# Patient Record
Sex: Male | Born: 1950 | Race: Black or African American | Hispanic: No | Marital: Single | Smoking: Light tobacco smoker
Health system: Southern US, Community
[De-identification: ages and names within clinical notes are randomized; demographics above are authoritative.]

## PROBLEM LIST (undated history)

## (undated) DIAGNOSIS — H269 Unspecified cataract: Secondary | ICD-10-CM

## (undated) DIAGNOSIS — E785 Hyperlipidemia, unspecified: Secondary | ICD-10-CM

## (undated) DIAGNOSIS — I639 Cerebral infarction, unspecified: Secondary | ICD-10-CM

## (undated) DIAGNOSIS — H409 Unspecified glaucoma: Secondary | ICD-10-CM

## (undated) DIAGNOSIS — I10 Essential (primary) hypertension: Secondary | ICD-10-CM

## (undated) HISTORY — PX: OTHER SURGICAL HISTORY: SHX169

## (undated) HISTORY — DX: Unspecified cataract: H26.9

## (undated) HISTORY — DX: Essential (primary) hypertension: I10

## (undated) HISTORY — DX: Unspecified glaucoma: H40.9

## (undated) HISTORY — PX: FRACTURE SURGERY: SHX138

## (undated) HISTORY — DX: Cerebral infarction, unspecified: I63.9

## (undated) HISTORY — DX: Hyperlipidemia, unspecified: E78.5

---

## 2004-09-14 ENCOUNTER — Emergency Department (HOSPITAL_COMMUNITY): Admission: EM | Admit: 2004-09-14 | Discharge: 2004-09-14 | Payer: Self-pay | Admitting: Emergency Medicine

## 2005-06-14 ENCOUNTER — Inpatient Hospital Stay (HOSPITAL_COMMUNITY): Admission: AC | Admit: 2005-06-14 | Discharge: 2005-06-19 | Payer: Self-pay

## 2006-10-16 ENCOUNTER — Emergency Department (HOSPITAL_COMMUNITY): Admission: EM | Admit: 2006-10-16 | Discharge: 2006-10-16 | Payer: Self-pay | Admitting: Emergency Medicine

## 2007-01-01 ENCOUNTER — Emergency Department (HOSPITAL_COMMUNITY): Admission: EM | Admit: 2007-01-01 | Discharge: 2007-01-01 | Payer: Self-pay | Admitting: Emergency Medicine

## 2007-07-11 ENCOUNTER — Emergency Department (HOSPITAL_COMMUNITY): Admission: EM | Admit: 2007-07-11 | Discharge: 2007-07-11 | Payer: Self-pay | Admitting: Emergency Medicine

## 2007-08-10 ENCOUNTER — Ambulatory Visit: Payer: Self-pay | Admitting: Internal Medicine

## 2007-08-10 ENCOUNTER — Ambulatory Visit: Payer: Self-pay | Admitting: *Deleted

## 2007-08-25 ENCOUNTER — Ambulatory Visit: Payer: Self-pay | Admitting: Internal Medicine

## 2007-08-25 LAB — CONVERTED CEMR LAB
AST: 50 units/L — ABNORMAL HIGH (ref 0–37)
Albumin: 4.1 g/dL (ref 3.5–5.2)
Alkaline Phosphatase: 66 units/L (ref 39–117)
Basophils Absolute: 0 10*3/uL (ref 0.0–0.1)
Basophils Relative: 0 % (ref 0–1)
Benzodiazepines.: NEGATIVE
Creatinine,U: 213.5 mg/dL
LDL Cholesterol: 89 mg/dL (ref 0–99)
Lymphocytes Relative: 40 % (ref 12–46)
MCHC: 33 g/dL (ref 30.0–36.0)
Monocytes Relative: 11 % (ref 3–12)
Neutro Abs: 1.6 10*3/uL — ABNORMAL LOW (ref 1.7–7.7)
Neutrophils Relative %: 47 % (ref 43–77)
Opiate Screen, Urine: NEGATIVE
PSA: 3.64 ng/mL (ref 0.10–4.00)
Phencyclidine (PCP): NEGATIVE
Potassium: 4.4 meq/L (ref 3.5–5.3)
Propoxyphene: NEGATIVE
RBC: 4.26 M/uL (ref 4.22–5.81)
RDW: 13.1 % (ref 11.5–15.5)
Sodium: 143 meq/L (ref 135–145)
TSH: 1.162 microintl units/mL (ref 0.350–5.50)
Total Protein: 7.1 g/dL (ref 6.0–8.3)

## 2007-09-05 ENCOUNTER — Ambulatory Visit: Payer: Self-pay | Admitting: Internal Medicine

## 2007-09-05 LAB — CONVERTED CEMR LAB
Hep A Total Ab: POSITIVE — AB
Hep B Core Total Ab: NEGATIVE

## 2007-09-07 ENCOUNTER — Ambulatory Visit: Payer: Self-pay | Admitting: Internal Medicine

## 2007-09-22 ENCOUNTER — Emergency Department (HOSPITAL_COMMUNITY): Admission: EM | Admit: 2007-09-22 | Discharge: 2007-09-23 | Payer: Self-pay | Admitting: Emergency Medicine

## 2007-10-17 ENCOUNTER — Ambulatory Visit: Payer: Self-pay | Admitting: Internal Medicine

## 2007-10-17 ENCOUNTER — Encounter: Payer: Self-pay | Admitting: Family Medicine

## 2007-10-17 LAB — CONVERTED CEMR LAB
HCV Genotype: 1
aPTT: 69 s — ABNORMAL HIGH (ref 24–37)

## 2007-11-28 ENCOUNTER — Ambulatory Visit: Payer: Self-pay | Admitting: Internal Medicine

## 2007-12-06 ENCOUNTER — Ambulatory Visit: Payer: Self-pay | Admitting: Internal Medicine

## 2007-12-06 ENCOUNTER — Encounter: Payer: Self-pay | Admitting: Family Medicine

## 2007-12-06 LAB — CONVERTED CEMR LAB
BUN: 20 mg/dL (ref 6–23)
Benzodiazepines.: NEGATIVE
Creatinine,U: 151.2 mg/dL
Marijuana Metabolite: NEGATIVE
Phencyclidine (PCP): NEGATIVE
Potassium: 4.2 meq/L (ref 3.5–5.3)
Propoxyphene: NEGATIVE

## 2008-01-31 ENCOUNTER — Ambulatory Visit: Payer: Self-pay | Admitting: Family Medicine

## 2008-02-24 ENCOUNTER — Ambulatory Visit: Payer: Self-pay | Admitting: Internal Medicine

## 2008-03-14 ENCOUNTER — Ambulatory Visit: Payer: Self-pay | Admitting: Internal Medicine

## 2008-03-27 ENCOUNTER — Ambulatory Visit (HOSPITAL_COMMUNITY): Admission: RE | Admit: 2008-03-27 | Discharge: 2008-03-27 | Payer: Self-pay | Admitting: Interventional Cardiology

## 2008-07-20 ENCOUNTER — Ambulatory Visit: Payer: Self-pay | Admitting: Internal Medicine

## 2008-07-23 ENCOUNTER — Ambulatory Visit (HOSPITAL_COMMUNITY): Admission: RE | Admit: 2008-07-23 | Discharge: 2008-07-23 | Payer: Self-pay | Admitting: Family Medicine

## 2008-07-30 ENCOUNTER — Emergency Department (HOSPITAL_COMMUNITY): Admission: EM | Admit: 2008-07-30 | Discharge: 2008-07-30 | Payer: Self-pay | Admitting: Emergency Medicine

## 2008-08-24 ENCOUNTER — Ambulatory Visit: Payer: Self-pay | Admitting: Internal Medicine

## 2008-11-12 ENCOUNTER — Encounter: Payer: Self-pay | Admitting: Internal Medicine

## 2008-11-12 ENCOUNTER — Ambulatory Visit: Payer: Self-pay | Admitting: Internal Medicine

## 2008-11-12 DIAGNOSIS — R21 Rash and other nonspecific skin eruption: Secondary | ICD-10-CM

## 2008-11-21 ENCOUNTER — Ambulatory Visit: Payer: Self-pay | Admitting: Internal Medicine
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
## 2008-12-28 ENCOUNTER — Emergency Department (HOSPITAL_COMMUNITY): Admission: EM | Admit: 2008-12-28 | Discharge: 2008-12-28 | Payer: Self-pay | Admitting: Emergency Medicine

## 2009-02-20 ENCOUNTER — Ambulatory Visit: Payer: Self-pay | Admitting: Internal Medicine
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
## 2010-09-07 LAB — DIFFERENTIAL
Lymphocytes Relative: 24 % (ref 12–46)
Lymphs Abs: 1.7 10*3/uL (ref 0.7–4.0)
Monocytes Absolute: 0.6 10*3/uL (ref 0.1–1.0)
Monocytes Relative: 8 % (ref 3–12)
Neutro Abs: 5 10*3/uL (ref 1.7–7.7)

## 2010-09-07 LAB — CBC
Hemoglobin: 12.1 g/dL — ABNORMAL LOW (ref 13.0–17.0)
RBC: 3.6 MIL/uL — ABNORMAL LOW (ref 4.22–5.81)
WBC: 7.4 10*3/uL (ref 4.0–10.5)

## 2010-09-07 LAB — BASIC METABOLIC PANEL
CO2: 21 mEq/L (ref 19–32)
Calcium: 8.8 mg/dL (ref 8.4–10.5)
GFR calc Af Amer: 57 mL/min — ABNORMAL LOW (ref >60)
GFR calc non Af Amer: 47 mL/min — ABNORMAL LOW (ref >60)
Sodium: 143 mEq/L (ref 135–145)

## 2010-09-07 LAB — POCT CARDIAC MARKERS
CKMB, poc: 2.1 ng/mL (ref 1.0–8.0)
Troponin i, poc: <0.05 ng/mL (ref 0.00–0.09)

## 2010-09-09 ENCOUNTER — Emergency Department (HOSPITAL_COMMUNITY)
Admission: EM | Admit: 2010-09-09 | Discharge: 2010-09-09 | Discharge status: Against Medical Advice | Payer: Medicaid Other | Attending: Emergency Medicine | Admitting: Emergency Medicine

## 2010-09-09 DIAGNOSIS — Z0389 Encounter for observation for other suspected diseases and conditions ruled out: Secondary | ICD-10-CM | POA: Insufficient documentation

## 2010-09-11 LAB — CBC
HCT: 40.2 % (ref 39.0–52.0)
Hemoglobin: 14 g/dL (ref 13.0–17.0)
MCHC: 34.9 g/dL (ref 30.0–36.0)
RBC: 4.16 MIL/uL — ABNORMAL LOW (ref 4.22–5.81)
RDW: 12.7 % (ref 11.5–15.5)

## 2010-09-11 LAB — COMPREHENSIVE METABOLIC PANEL
Alkaline Phosphatase: 65 U/L (ref 39–117)
BUN: 19 mg/dL (ref 6–23)
CO2: 26 mEq/L (ref 19–32)
GFR calc non Af Amer: 41 mL/min — ABNORMAL LOW (ref >60)
Glucose, Bld: 122 mg/dL — ABNORMAL HIGH (ref 70–99)
Potassium: 5 mEq/L (ref 3.5–5.1)
Total Protein: 7 g/dL (ref 6.0–8.3)

## 2010-09-11 LAB — DIFFERENTIAL
Basophils Absolute: 0 10*3/uL (ref 0.0–0.1)
Basophils Relative: 0 % (ref 0–1)
Eosinophils Absolute: 0 10*3/uL (ref 0.0–0.7)
Monocytes Relative: 4 % (ref 3–12)
Neutro Abs: 6.9 10*3/uL (ref 1.7–7.7)
Neutrophils Relative %: 86 % — ABNORMAL HIGH (ref 43–77)

## 2010-09-11 LAB — POCT CARDIAC MARKERS
CKMB, poc: 3.3 ng/mL (ref 1.0–8.0)
CKMB, poc: 4.2 ng/mL (ref 1.0–8.0)
Myoglobin, poc: 214 ng/mL (ref 12–200)
Myoglobin, poc: 245 ng/mL (ref 12–200)
Troponin i, poc: <0.05 ng/mL (ref 0.00–0.09)
Troponin i, poc: <0.05 ng/mL (ref 0.00–0.09)

## 2010-09-11 LAB — RAPID URINE DRUG SCREEN, HOSP PERFORMED
Cocaine: POSITIVE — AB
Opiates: POSITIVE — AB
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
## 2011-02-20 LAB — POCT CARDIAC MARKERS: Myoglobin, poc: 160

## 2011-02-20 LAB — DIFFERENTIAL
Basophils Absolute: 0
Basophils Relative: 0
Eosinophils Absolute: 0.1
Eosinophils Relative: 3
Neutrophils Relative %: 60

## 2011-02-20 LAB — PROTIME-INR
INR: 1
Prothrombin Time: 13

## 2011-02-20 LAB — CBC
HCT: 44.5
MCHC: 33.9
MCV: 95.2
Platelets: 297
RDW: 12.8
WBC: 5.9

## 2011-02-20 LAB — I-STAT 8, (EC8 V) (CONVERTED LAB)
Acid-Base Excess: 2
Chloride: 103
HCT: 49
Operator id: 288831
Potassium: 4.1
TCO2: 31
pCO2, Ven: 52.6 — ABNORMAL HIGH
pH, Ven: 7.349 — ABNORMAL HIGH

## 2011-02-20 LAB — POCT I-STAT CREATININE
Creatinine, Ser: 1.3
Operator id: 288831

## 2011-02-24 LAB — POCT I-STAT, CHEM 8
Calcium, Ion: 1.16
HCT: 40
Hemoglobin: 13.6
Sodium: 142
TCO2: 24

## 2011-02-24 LAB — DIFFERENTIAL
Basophils Absolute: 0
Basophils Relative: 0
Eosinophils Relative: 0
Monocytes Absolute: 0.8

## 2011-02-24 LAB — CBC
HCT: 38.3 — ABNORMAL LOW
Hemoglobin: 13
MCHC: 34
Platelets: 301
RDW: 12.8

## 2011-02-24 LAB — ETHANOL: Alcohol, Ethyl (B): 147 — ABNORMAL HIGH

## 2013-11-13 ENCOUNTER — Encounter: Payer: Self-pay | Admitting: Internal Medicine

## 2013-11-13 ENCOUNTER — Ambulatory Visit (INDEPENDENT_AMBULATORY_CARE_PROVIDER_SITE_OTHER): Payer: Medicaid Other | Admitting: Internal Medicine

## 2013-11-13 VITALS — BP 160/92 | HR 68 | Temp 98.3°F | Resp 20 | Ht 76.0 in | Wt 243.0 lb

## 2013-11-13 DIAGNOSIS — E785 Hyperlipidemia, unspecified: Secondary | ICD-10-CM | POA: Insufficient documentation

## 2013-11-13 DIAGNOSIS — R519 Headache, unspecified: Secondary | ICD-10-CM

## 2013-11-13 DIAGNOSIS — Z8673 Personal history of transient ischemic attack (TIA), and cerebral infarction without residual deficits: Secondary | ICD-10-CM

## 2013-11-13 DIAGNOSIS — I10 Essential (primary) hypertension: Secondary | ICD-10-CM | POA: Insufficient documentation

## 2013-11-13 DIAGNOSIS — R51 Headache: Secondary | ICD-10-CM

## 2013-11-13 DIAGNOSIS — K5900 Constipation, unspecified: Secondary | ICD-10-CM

## 2013-11-13 DIAGNOSIS — M1990 Unspecified osteoarthritis, unspecified site: Secondary | ICD-10-CM

## 2013-11-13 DIAGNOSIS — H409 Unspecified glaucoma: Secondary | ICD-10-CM

## 2013-11-13 DIAGNOSIS — M129 Arthropathy, unspecified: Secondary | ICD-10-CM

## 2013-11-13 LAB — CBC WITH DIFFERENTIAL/PLATELET
BASOS PCT: 0 % (ref 0–1)
Basophils Absolute: 0 10*3/uL (ref 0.0–0.1)
EOS ABS: 0.2 10*3/uL (ref 0.0–0.7)
Eosinophils Relative: 3 % (ref 0–5)
HEMATOCRIT: 42 % (ref 39.0–52.0)
HEMOGLOBIN: 14.7 g/dL (ref 13.0–17.0)
LYMPHS ABS: 2 10*3/uL (ref 0.7–4.0)
Lymphocytes Relative: 32 % (ref 12–46)
MCH: 31.3 pg (ref 26.0–34.0)
MCHC: 35 g/dL (ref 30.0–36.0)
MCV: 89.4 fL (ref 78.0–100.0)
MONO ABS: 0.4 10*3/uL (ref 0.1–1.0)
MONOS PCT: 7 % (ref 3–12)
Neutro Abs: 3.6 10*3/uL (ref 1.7–7.7)
Neutrophils Relative %: 58 % (ref 43–77)
Platelets: 273 10*3/uL (ref 150–400)
RBC: 4.7 MIL/uL (ref 4.22–5.81)
RDW: 13.3 % (ref 11.5–15.5)
WBC: 6.2 10*3/uL (ref 4.0–10.5)

## 2013-11-13 LAB — COMPREHENSIVE METABOLIC PANEL
ALBUMIN: 4.1 g/dL (ref 3.5–5.2)
ALK PHOS: 95 U/L (ref 39–117)
ALT: 51 U/L (ref 0–53)
AST: 48 U/L — ABNORMAL HIGH (ref 0–37)
BILIRUBIN TOTAL: 0.5 mg/dL (ref 0.2–1.2)
BUN: 15 mg/dL (ref 6–23)
CO2: 24 meq/L (ref 19–32)
Calcium: 9.3 mg/dL (ref 8.4–10.5)
Chloride: 103 mEq/L (ref 96–112)
Creat: 1.22 mg/dL (ref 0.50–1.35)
GLUCOSE: 88 mg/dL (ref 70–99)
Potassium: 4.4 mEq/L (ref 3.5–5.3)
Sodium: 138 mEq/L (ref 135–145)
Total Protein: 7.4 g/dL (ref 6.0–8.3)

## 2013-11-13 LAB — MAGNESIUM: MAGNESIUM: 2 mg/dL (ref 1.5–2.5)

## 2013-11-13 MED ORDER — AMLODIPINE BESYLATE 5 MG PO TABS: 5.0000 mg | ORAL_TABLET | Freq: Every day | ORAL | Status: DC

## 2013-11-13 MED ORDER — ASPIRIN 81 MG PO TABS: 81.0000 mg | ORAL_TABLET | Freq: Every day | ORAL | Status: DC

## 2013-11-13 MED ORDER — BUTALBITAL-APAP-CAFFEINE 50-325-40 MG PO TABS: 1.0000 | ORAL_TABLET | Freq: Four times a day (QID) | ORAL | Status: DC | PRN

## 2013-11-13 MED ORDER — CALCIUM POLYCARBOPHIL 625 MG PO TABS: 625.0000 mg | ORAL_TABLET | Freq: Every day | ORAL | Status: AC

## 2013-11-13 MED ORDER — DORZOLAMIDE HCL-TIMOLOL MAL PF 22.3-6.8 MG/ML OP SOLN: 1.0000 [drp] | Freq: Two times a day (BID) | OPHTHALMIC | Status: AC

## 2013-11-13 MED ORDER — NAPROXEN 500 MG PO TABS: 500.0000 mg | ORAL_TABLET | Freq: Two times a day (BID) | ORAL | Status: DC
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
## 2013-11-14 DIAGNOSIS — M1990 Unspecified osteoarthritis, unspecified site: Secondary | ICD-10-CM | POA: Insufficient documentation

## 2013-11-14 DIAGNOSIS — R51 Headache: Secondary | ICD-10-CM

## 2013-11-14 DIAGNOSIS — Z8673 Personal history of transient ischemic attack (TIA), and cerebral infarction without residual deficits: Secondary | ICD-10-CM | POA: Insufficient documentation

## 2013-11-14 DIAGNOSIS — R519 Headache, unspecified: Secondary | ICD-10-CM | POA: Insufficient documentation

## 2013-11-14 DIAGNOSIS — H409 Unspecified glaucoma: Secondary | ICD-10-CM | POA: Insufficient documentation

## 2013-11-14 DIAGNOSIS — K5900 Constipation, unspecified: Secondary | ICD-10-CM | POA: Insufficient documentation

## 2013-11-14 LAB — URINALYSIS
Bilirubin Urine: NEGATIVE
GLUCOSE, UA: NEGATIVE mg/dL
Hgb urine dipstick: NEGATIVE
Ketones, ur: NEGATIVE mg/dL
LEUKOCYTES UA: NEGATIVE
NITRITE: NEGATIVE
PROTEIN: NEGATIVE mg/dL
Specific Gravity, Urine: 1.018 (ref 1.005–1.030)
Urobilinogen, UA: 2 mg/dL — ABNORMAL HIGH (ref 0.0–1.0)
pH: 5.5 (ref 5.0–8.0)

## 2013-11-24 ENCOUNTER — Encounter: Payer: Self-pay | Admitting: Family Medicine

## 2013-11-24 ENCOUNTER — Ambulatory Visit (INDEPENDENT_AMBULATORY_CARE_PROVIDER_SITE_OTHER): Payer: No Typology Code available for payment source | Admitting: Family Medicine

## 2013-11-24 VITALS — BP 133/92 | HR 80 | Temp 98.3°F | Resp 20 | Ht 76.0 in | Wt 241.0 lb

## 2013-11-24 DIAGNOSIS — Z8673 Personal history of transient ischemic attack (TIA), and cerebral infarction without residual deficits: Secondary | ICD-10-CM

## 2013-11-24 DIAGNOSIS — Z0000 Encounter for general adult medical examination without abnormal findings: Secondary | ICD-10-CM

## 2013-11-24 DIAGNOSIS — M129 Arthropathy, unspecified: Secondary | ICD-10-CM

## 2013-11-24 DIAGNOSIS — H409 Unspecified glaucoma: Secondary | ICD-10-CM

## 2013-11-24 DIAGNOSIS — M1990 Unspecified osteoarthritis, unspecified site: Secondary | ICD-10-CM

## 2013-11-24 DIAGNOSIS — I10 Essential (primary) hypertension: Secondary | ICD-10-CM

## 2013-11-24 DIAGNOSIS — R519 Headache, unspecified: Secondary | ICD-10-CM

## 2013-11-24 DIAGNOSIS — E785 Hyperlipidemia, unspecified: Secondary | ICD-10-CM

## 2013-11-24 DIAGNOSIS — R51 Headache: Secondary | ICD-10-CM

## 2013-11-24 LAB — LIPID PANEL
CHOL/HDL RATIO: 3.3 ratio
CHOLESTEROL: 167 mg/dL (ref 0–200)
HDL: 50 mg/dL (ref >39)
LDL Cholesterol: 103 mg/dL — ABNORMAL HIGH (ref 0–99)
Triglycerides: 69 mg/dL (ref <150)
VLDL: 14 mg/dL (ref 0–40)
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
## 2013-11-25 LAB — PSA: PSA: 5.93 ng/mL — AB (ref <=4.00)

## 2013-11-27 ENCOUNTER — Telehealth: Payer: Self-pay

## 2013-11-27 ENCOUNTER — Telehealth: Payer: Self-pay | Admitting: Family Medicine

## 2013-11-27 DIAGNOSIS — R972 Elevated prostate specific antigen [PSA]: Secondary | ICD-10-CM

## 2013-11-27 NOTE — Telephone Encounter (Signed)
PSA elevated at 5.93, will send urology referral.

## 2013-11-27 NOTE — Telephone Encounter (Signed)
Pt 's referral for Uro. has been sent through to Epic. [facility] will notify Pt of date as well as time when referral has been recieved and reviewed.

## 2013-12-06 ENCOUNTER — Telehealth: Payer: Self-pay | Admitting: Internal Medicine

## 2013-12-06 ENCOUNTER — Telehealth: Payer: Self-pay

## 2013-12-06 NOTE — Telephone Encounter (Signed)
Patient needs to have all RX that were e-scribed last visit to be printed so he is able to [facility].

## 2013-12-06 NOTE — Telephone Encounter (Signed)
Darryl Mccann, I don't know what medications. So please find out from the patient. Also this patient saw Armeniahina last week not me. So were these prescriptions issued by me or Armeniahina. Please clarify and have some detail to the request.

## 2013-12-06 NOTE — Telephone Encounter (Signed)
Call Documentation      Wende NeighborsCharlene D Thomas at 12/06/2013 11:30 AM      Status: Signed            Patient needs to have all RX that were e-scribed last visit to be printed so he is able to [facility] to [facility] them [facility].

## 2013-12-07 ENCOUNTER — Telehealth: Payer: Self-pay

## 2013-12-07 NOTE — Telephone Encounter (Signed)
ERROR

## 2013-12-08 ENCOUNTER — Telehealth (HOSPITAL_COMMUNITY): Payer: Self-pay | Admitting: *Deleted

## 2013-12-08 NOTE — Telephone Encounter (Signed)
This RN contacted patient to clarify patient's request and need. Patient states he need refills on his medications. Notified patient of status of all medications clarified with pharmacy today. Patient acknowledges. Patient requesting refill of fioricet. Informed patient that I will put refill request in and it should be ready for pickup by Tuesday July 14. Patient acknowledges. MD notified.

## 2013-12-08 NOTE — Telephone Encounter (Signed)
This RN contacted pharmacy to clarify patient's medications. Medications clarified, refills present at pharmacy and are available, all medications except fioricet. The eye drops are available in generic for 10$ or Mr.Brewbaker can potentially get them for no charge through the PASS program which patient will have to complete paperwork for the program. And Fibercon is an over-the-counter medication.

## 2013-12-12 ENCOUNTER — Telehealth: Payer: Self-pay

## 2013-12-12 ENCOUNTER — Other Ambulatory Visit: Payer: Self-pay | Admitting: Internal Medicine

## 2013-12-12 DIAGNOSIS — R519 Headache, unspecified: Secondary | ICD-10-CM

## 2013-12-12 DIAGNOSIS — R51 Headache: Principal | ICD-10-CM

## 2013-12-12 NOTE — Telephone Encounter (Signed)
Pt's Rx for Fioricet has been faxed over to Pt's Pharm.Pt has been notified.Pt voiced understanding of this.

## 2013-12-12 NOTE — Telephone Encounter (Signed)
Pt called requesting a refill on his Fioricet.Msg has been sent to [facility],as well as [facility]

## 2013-12-12 NOTE — Telephone Encounter (Signed)
Prescription refilled for Fiorcet. Pt need follow up appointment for headaches.

## 2013-12-13 ENCOUNTER — Telehealth: Payer: Self-pay

## 2013-12-13 NOTE — Telephone Encounter (Signed)
Pt was contacted to alert him of setting an appointment to come in to be seen for his Migranes.Pt was unavailable will contact him at a later time

## 2013-12-14 ENCOUNTER — Telehealth: Payer: Self-pay

## 2013-12-14 NOTE — Telephone Encounter (Signed)
Pt was contacted and VM was left  explaining that he would need to contact office to make a F/U appointment to be seen for Migranes.Pt was also told the Rx he recieved this week for Migranes would be his last till he comes in to be seen.

## 2013-12-29 ENCOUNTER — Telehealth: Payer: Self-pay | Admitting: Internal Medicine

## 2013-12-29 NOTE — Telephone Encounter (Signed)
Patient would like referral for Opthalmalogist.

## 2013-12-29 NOTE — Telephone Encounter (Signed)
Called patient to ask about current insurance. To see if he needed a referral thru us or if he could just call and make an appointment.  We do NOT have the correct phone number where he is staying, and the man who answered the phone states he doesn't know if he has a phone yet. The man who answered the phone would not give me any other contact information. Thanks!

## 2014-01-15 ENCOUNTER — Telehealth: Payer: Self-pay | Admitting: Internal Medicine

## 2014-01-15 DIAGNOSIS — Z8669 Personal history of other diseases of the nervous system and sense organs: Secondary | ICD-10-CM

## 2014-01-15 NOTE — Telephone Encounter (Signed)
Patient called requesting referral for Opthalmologist.

## 2014-01-15 NOTE — Telephone Encounter (Signed)
Armeniahina, This patient last saw you on 11/24/2013 and is requesting a referral for opthalmology. His next appointment with us is 02/26/2014. Can this be put in or does he need to wait until next appointment? Please advise. Thanks!

## 2014-01-16 NOTE — Telephone Encounter (Signed)
Sent referral for opthalmology

## 2014-02-26 ENCOUNTER — Ambulatory Visit (INDEPENDENT_AMBULATORY_CARE_PROVIDER_SITE_OTHER): Payer: No Typology Code available for payment source | Admitting: Internal Medicine

## 2014-02-26 ENCOUNTER — Encounter: Payer: Self-pay | Admitting: Internal Medicine

## 2014-02-26 VITALS — BP 164/101 | HR 76 | Temp 98.3°F | Resp 16 | Ht 76.0 in | Wt 241.0 lb

## 2014-02-26 DIAGNOSIS — B351 Tinea unguium: Secondary | ICD-10-CM | POA: Insufficient documentation

## 2014-02-26 DIAGNOSIS — Z1211 Encounter for screening for malignant neoplasm of colon: Secondary | ICD-10-CM | POA: Insufficient documentation

## 2014-02-26 DIAGNOSIS — I10 Essential (primary) hypertension: Secondary | ICD-10-CM

## 2014-02-26 DIAGNOSIS — N529 Male erectile dysfunction, unspecified: Secondary | ICD-10-CM | POA: Insufficient documentation

## 2014-02-26 DIAGNOSIS — R519 Headache, unspecified: Secondary | ICD-10-CM

## 2014-02-26 DIAGNOSIS — Z23 Encounter for immunization: Secondary | ICD-10-CM

## 2014-02-26 DIAGNOSIS — R51 Headache: Secondary | ICD-10-CM

## 2014-02-26 LAB — BASIC METABOLIC PANEL
BUN: 16 mg/dL (ref 6–23)
CHLORIDE: 103 meq/L (ref 96–112)
CO2: 26 mEq/L (ref 19–32)
CREATININE: 1.08 mg/dL (ref 0.50–1.35)
Calcium: 9.6 mg/dL (ref 8.4–10.5)
GLUCOSE: 108 mg/dL — AB (ref 70–99)
Potassium: 3.7 mEq/L (ref 3.5–5.3)
Sodium: 141 mEq/L (ref 135–145)

## 2014-02-26 LAB — MAGNESIUM: Magnesium: 1.9 mg/dL (ref 1.5–2.5)

## 2014-02-26 MED ORDER — HYDROCHLOROTHIAZIDE 12.5 MG PO TABS: 12.5000 mg | ORAL_TABLET | Freq: Every day | ORAL | Status: DC

## 2014-02-26 MED ORDER — BUTALBITAL-APAP-CAFFEINE 50-325-40 MG PO TABS: ORAL_TABLET | ORAL | Status: DC

## 2014-02-26 MED ORDER — AMLODIPINE BESYLATE 10 MG PO TABS: 10.0000 mg | ORAL_TABLET | Freq: Every day | ORAL | Status: DC

## 2014-02-26 NOTE — Progress Notes (Signed)
Patient ID: Darryl Mccann, male   DOB: [birth year], 63 y.o.   MRN: [id number]   Darryl Mccann, is a 63 y.o. male  [id number]  [id number]  DOB - June 19, 1950  CC:  Chief Complaint  Patient presents with  . Follow-up       HPI: Darryl Mccann is a 63 y.o. male here today to follow up on HTN. Pt states that he has been compliant with antihypertensive medication.  He reports that he has had some fleeting episodes of chest pain in the retrosternal area in the last 6 months but none in the last 30 days. He is unsure if it was related to activity and cannot really describe the character or intensity of the pain. He states that he thought it to be related to his HTN so did not call to report it.    He also reports that he had some relief of his H/A with Fioricet but has been out of the medication. He states that he has HA almost every and he wakes up with the headache. He reports the HA localized at the top of his head, throbbing in nature and sometimes associated with black spots. He denies any associated focal neurological deficits. Pt does have glaucoma and has been referred to Opthalmology, however the referral ahs been delayed because of the "[employer]" arrangement which is essentially "gratis" care. However patient now has Medicare and Medicaid and the referral will be re-submitted under this payor source which should expedite the process.  Pt also reports an inabilty to maintain an erection and an inconsistency in achieving an erection. He is requesting Viagra.  Patient has No chest pain, No abdominal pain - No Nausea, No new weakness tingling or numbness, No Cough - SOB.  Allergies  Allergen Reactions  . Cyclobenzaprine Hcl    Past Medical History  Diagnosis Date  . Hypertension   . Hyperlipidemia   . Stroke   . Cataract   . Glaucoma    Current Outpatient Prescriptions on File Prior to Visit  Medication Sig Dispense Refill  . aspirin 81 MG tablet Take 1 tablet  (81 mg total) by mouth daily.  30 tablet  3  . Dorzolamide HCl-Timolol Mal PF 22.3-6.8 MG/ML SOLN Apply 1 drop to eye 2 (two) times daily.  180 each  3  . naproxen (NAPROSYN) 500 MG tablet Take 1 tablet (500 mg total) by mouth 2 (two) times daily with a meal.  60 tablet  3  . polycarbophil (FIBERCON) 625 MG tablet Take 1 tablet (625 mg total) by mouth daily.  30 tablet  3   No current facility-administered medications on file prior to visit.   No family history on file. History   Social History  . Marital Status: Single    Spouse Name: N/A    Number of Children: N/A  . Years of Education: N/A   Occupational History  . Not on file.   Social History Main Topics  . Smoking status: Light Tobacco Smoker -- 0.07 packs/day for .5 years    Types: Cigarettes  . Smokeless tobacco: Never Used  . Alcohol Use: 0.6 oz/week    1 Cans of beer per week     Comment: Beer  . Drug Use: No  . Sexual Activity: Not on file   Other Topics Concern  . Not on file   Social History Narrative  . No narrative on file    Review of Systems: Constitutional: Negative for fever, chills, diaphoresis, activity change, appetite  change and fatigue. HENT: Negative for ear pain, nosebleeds, congestion, facial swelling, rhinorrhea, neck pain, neck stiffness and ear discharge.  Eyes: Negative for pain, discharge, redness, itching and visual disturbance. Respiratory: Negative for cough, choking, chest tightness, shortness of breath, wheezing and stridor.  Cardiovascular: Negative for chest pain, palpitations and leg swelling. Gastrointestinal: Negative for abdominal distention. Genitourinary: Negative for dysuria, urgency, frequency, hematuria, flank pain, decreased urine volume, difficulty urinating and dyspareunia.  Musculoskeletal: Negative for back pain, joint swelling, arthralgia and gait problem. Neurological: Negative for dizziness, tremors, seizures, syncope, facial asymmetry, speech difficulty, weakness,  light-headedness, numbness and headaches.  Hematological: Negative for adenopathy. Does not bruise/bleed easily. Psychiatric/Behavioral: Negative for hallucinations, behavioral problems, confusion, dysphoric mood, decreased concentration and agitation.    Objective:   Filed Vitals:   02/26/14 1459  BP: 164/101  Pulse: 76  Temp: 98.3 F (36.8 C)  Resp: 16   Physical Exam: Constitutional: Patient appears well-developed and well-nourished. No distress. HENT: Normocephalic, atraumatic, External right and left ear normal. Oropharynx is clear and moist.  Eyes: Conjunctivae and EOM are normal. PERRLA, no scleral icterus. Neck: Normal ROM. Neck supple. No JVD. No tracheal deviation. No thyromegaly. CVS: RRR, S1/S2 +, no murmurs, no gallops, no carotid bruit.  Pulmonary: Effort and breath sounds normal, no stridor, rhonchi, wheezes, rales.  Abdominal: Soft. BS +, no distension, tenderness, rebound or guarding.  Musculoskeletal: Normal range of motion. No edema and no tenderness.  Lymphadenopathy: No lymphadenopathy noted, cervical, inguinal or axillary Neuro: Alert. Normal reflexes, muscle tone coordination. No cranial nerve deficit. Skin: Skin is warm and dry. No rash noted. Not diaphoretic. No erythema. No pallor. Psychiatric: Normal mood and affect. Behavior, judgment, thought content normal. Genital Examination: Pt refusing examination  Lab Results  Component Value Date   WBC 6.2 11/13/2013   HGB 14.7 11/13/2013   HCT 42.0 11/13/2013   MCV 89.4 11/13/2013   PLT 273 11/13/2013   Lab Results  Component Value Date   CREATININE 1.22 11/13/2013   BUN 15 11/13/2013   NA 138 11/13/2013   K 4.4 11/13/2013   CL 103 11/13/2013   CO2 24 11/13/2013    No results found for this basename: HGBA1C   Lipid Panel     Component Value Date/Time   CHOL 167 11/24/2013 1523   TRIG 69 11/24/2013 1523   HDL 50 11/24/2013 1523   CHOLHDL 3.3 11/24/2013 1523   VLDL 14 11/24/2013 1523   LDLCALC 103*  11/24/2013 1523       Assessment and plan:   1. Essential hypertension - BP poorly controlled. Will increase Norvasc to 10 mg and add HCTZ 12.5 mg - amLODipine (NORVASC) 10 MG tablet; Take 1 tablet (10 mg total) by mouth daily.  Dispense: 90 tablet; Refill: 3 - hydrochlorothiazide (HYDRODIURIL) 12.5 MG tablet; Take 1 tablet (12.5 mg total) by mouth daily.  Dispense: 90 tablet; Refill: 3 - EKG 12-Lead - Basic Metabolic Panel - Hemoglobin A1C - Microalbumin, urine - Magnesium  2. Frequent headaches - Will treat conservatively. Asked patient to keep a HA diary and will re-assess in 1 month - butalbital-acetaminophen-caffeine (FIORICET, ESGIC) 50-325-40 MG per tablet; TAKE ONE TABLET BY MOUTH EVERY 6 HOURS AS NEEDED FOR MIGRAINE  Dispense: 30 tablet; Refill: 0 - Magnesium  3. Immunization due - Flu Vaccine QUAD 36+ mos PF IM (Fluarix Quad PF) - Pneumococcal polysaccharide vaccine 23-valent greater than or equal to 2yo subcutaneous/IM  4. Onychomycosis of toenail - Pt also has hypertrophy of the nails  which will likely have poor response to topical Tx. I am reticent to prescribe Lamisil due to Hepatic risk and HA in a patient who already has significant headaches.  - Ambulatory referral to Podiatry  5. Erectile dysfunction, unspecified erectile dysfunction type - Pt has some subtle EKG changes on examination and has reported episodes of CP in the last year. I will holds on prescribing Viagra as requested and address after he has been seen by Cardiology.  6. Colon cancer screening - Ambulatory referral to Gastroenterology  7. Glaucoma - Refer to Opthalmology  Follow-up 1 month  The patient was given clear instructions to go to ER or return to medical center if symptoms don't improve, worsen or new problems develop. The patient verbalized understanding. The patient was told to call to get lab results if they haven't heard anything in the next week.     This note has been created  with Education officer, environmental. Any transcriptional errors are unintentional.    Erma Joubert A., MD [facility] [place], [place] [phone]   02/26/2014, 4:08 PM

## 2014-02-27 LAB — MICROALBUMIN, URINE: Microalb, Ur: 6.6 mg/dL — ABNORMAL HIGH (ref <2.0)

## 2014-02-27 LAB — HEMOGLOBIN A1C
Hgb A1c MFr Bld: 6.5 % — ABNORMAL HIGH (ref <5.7)
Mean Plasma Glucose: 140 mg/dL — ABNORMAL HIGH (ref <117)

## 2014-04-02 ENCOUNTER — Other Ambulatory Visit: Payer: Self-pay | Admitting: Internal Medicine

## 2014-04-02 NOTE — Telephone Encounter (Signed)
Refill request for Fioricet. LOV 02/26/2014. Please advise. Thanks!

## 2014-04-06 ENCOUNTER — Encounter: Payer: Self-pay | Admitting: Internal Medicine

## 2014-04-30 ENCOUNTER — Telehealth: Payer: Self-pay | Admitting: Internal Medicine

## 2014-04-30 NOTE — Telephone Encounter (Signed)
Left message advising patient that [facility] had [facility] being he has the orange card. Thanks!

## 2014-04-30 NOTE — Telephone Encounter (Signed)
Patient called stating he needs a referral to an ophthalmalogist.

## 2014-05-03 ENCOUNTER — Other Ambulatory Visit: Payer: Self-pay | Admitting: Internal Medicine

## 2014-05-29 ENCOUNTER — Other Ambulatory Visit: Payer: Self-pay | Admitting: Internal Medicine

## 2014-06-04 ENCOUNTER — Ambulatory Visit (INDEPENDENT_AMBULATORY_CARE_PROVIDER_SITE_OTHER): Payer: No Typology Code available for payment source | Admitting: Internal Medicine

## 2014-06-04 VITALS — BP 151/99 | HR 75 | Temp 98.1°F | Resp 16 | Ht 76.0 in | Wt 236.0 lb

## 2014-06-04 DIAGNOSIS — G43001 Migraine without aura, not intractable, with status migrainosus: Secondary | ICD-10-CM

## 2014-06-04 DIAGNOSIS — I10 Essential (primary) hypertension: Secondary | ICD-10-CM

## 2014-06-04 DIAGNOSIS — N528 Other male erectile dysfunction: Secondary | ICD-10-CM

## 2014-06-04 MED ORDER — BUTALBITAL-APAP-CAFFEINE 50-325-40 MG PO TABS: 1.0000 | ORAL_TABLET | Freq: Two times a day (BID) | ORAL | Status: DC | PRN

## 2014-06-04 MED ORDER — LISINOPRIL 10 MG PO TABS: 10.0000 mg | ORAL_TABLET | Freq: Every day | ORAL | Status: DC

## 2014-06-06 MED ORDER — SILDENAFIL CITRATE 50 MG PO TABS: 50.0000 mg | ORAL_TABLET | Freq: Every day | ORAL | Status: AC | PRN

## 2014-06-06 NOTE — Progress Notes (Signed)
Patient ID: Darryl Mccann, male   DOB: 02/18/1951, 64 y.o.   MRN: [id number]   Darryl Mccann, is a 64 y.o. male  [id number]  [id number]  DOB - 03/21/1951  CC:  Chief Complaint  Patient presents with  . Follow-up       HPI: Darryl RightFrederick Rallo is a 64 y.o. male here today for follow up on HTN and also requesting Viagra for Erectile Dysfunction. Pt also reports that he was told by his Opthalmologist that he was "going blind" as a result of Glaucoma.  Pt also reports persistent HA which are throbbing in nature and sometimes affected by light. He denies nausea and emesis. He states that pain is relieved by Fioricet.  Patient has No chest pain, No abdominal pain - No Nausea, No new weakness tingling or numbness, No Cough - SOB.  Allergies  Allergen Reactions  . Cyclobenzaprine Hcl    Past Medical History  Diagnosis Date  . Hypertension   . Hyperlipidemia   . Stroke   . Cataract   . Glaucoma    Current Outpatient Prescriptions on File Prior to Visit  Medication Sig Dispense Refill  . amLODipine (NORVASC) 10 MG tablet Take 1 tablet (10 mg total) by mouth daily. 90 tablet 3  . aspirin 81 MG tablet Take 1 tablet (81 mg total) by mouth daily. 30 tablet 3  . butalbital-acetaminophen-caffeine (FIORICET, ESGIC) 50-325-40 MG per tablet TAKE ONE TABLET BY MOUTH EVERY 6 HOURS AS NEEDED FOR MIGRAINE 30 tablet 0  . Dorzolamide HCl-Timolol Mal PF 22.3-6.8 MG/ML SOLN Apply 1 drop to eye 2 (two) times daily. 180 each 3  . dorzolamide-timolol (COSOPT) 22.3-6.8 MG/ML ophthalmic solution PLACE 1 DROP IN EYE(S) TWICE DAILY 10 mL 2  . hydrochlorothiazide (HYDRODIURIL) 12.5 MG tablet Take 1 tablet (12.5 mg total) by mouth daily. 90 tablet 3  . naproxen (NAPROSYN) 500 MG tablet TAKE 1 TABLET TWICE A DAY WITH A MEAL 60 tablet 1  . polycarbophil (FIBERCON) 625 MG tablet Take 1 tablet (625 mg total) by mouth daily. 30 tablet 3   No current facility-administered medications on file prior to  visit.   No family history on file. History   Social History  . Marital Status: Single    Spouse Name: N/A    Number of Children: N/A  . Years of Education: N/A   Occupational History  . Not on file.   Social History Main Topics  . Smoking status: Light Tobacco Smoker -- 0.07 packs/day for .5 years    Types: Cigarettes  . Smokeless tobacco: Never Used  . Alcohol Use: 0.6 oz/week    1 Cans of beer per week     Comment: Beer  . Drug Use: No  . Sexual Activity: Not on file   Other Topics Concern  . Not on file   Social History Narrative  . No narrative on file    Review of Systems: Constitutional: Negative for fever, chills, diaphoresis, activity change, appetite change and fatigue. HENT: Negative for ear pain, nosebleeds, congestion, facial swelling, rhinorrhea, neck pain, neck stiffness and ear discharge.  Eyes: Negative for pain, discharge, redness, itching and visual disturbance. Respiratory: Negative for cough, choking, chest tightness, shortness of breath, wheezing and stridor.  Cardiovascular: Negative for chest pain, palpitations and leg swelling. Gastrointestinal: Negative for abdominal distention. Genitourinary: Negative for dysuria, urgency, frequency, hematuria, flank pain, decreased urine volume, difficulty urinating. Musculoskeletal: Negative for back pain, joint swelling, arthralgia and gait problem. Neurological: Negative for dizziness, tremors, seizures, syncope,  facial asymmetry, speech difficulty, weakness, light-headedness, numbness and headaches.  Hematological: Negative for adenopathy. Does not bruise/bleed easily. Psychiatric/Behavioral: Negative for hallucinations, behavioral problems, confusion, dysphoric mood, decreased concentration and agitation.     Objective:    Filed Vitals:   06/04/14 1550  BP: 151/99  Pulse: 75  Temp: 98.1 F (36.7 C)  Resp: 16    Physical Exam: Constitutional: Patient appears well-developed and well-nourished.  No distress. HENT: Normocephalic, atraumatic, External right and left ear normal. Oropharynx is clear and moist.  Eyes: Conjunctivae and EOM are normal. PERRLA, no scleral icterus. Neck: Normal ROM. Neck supple. No JVD. No tracheal deviation. No thyromegaly. CVS: RRR, S1/S2 +, no murmurs, no gallops, no carotid bruit.  Pulmonary: Effort and breath sounds normal, no stridor, rhonchi, wheezes, rales.  Abdominal: Soft. BS +, no distension, tenderness, rebound or guarding.  Musculoskeletal: Normal range of motion. No edema and no tenderness.  Lymphadenopathy: No lymphadenopathy noted, cervical, inguinal or axillary Neuro: Alert. Normal reflexes, muscle tone coordination. No cranial nerve deficit. Skin: Skin is warm and dry. No rash noted. Not diaphoretic. No erythema. No pallor. Psychiatric: Normal mood and affect. Behavior, judgment, thought content normal.   Lab Results  Component Value Date   WBC 6.2 11/13/2013   HGB 14.7 11/13/2013   HCT 42.0 11/13/2013   MCV 89.4 11/13/2013   PLT 273 11/13/2013   Lab Results  Component Value Date   CREATININE 1.08 02/26/2014   BUN 16 02/26/2014   NA 141 02/26/2014   K 3.7 02/26/2014   CL 103 02/26/2014   CO2 26 02/26/2014    Lab Results  Component Value Date   HGBA1C 6.5* 02/26/2014   Lipid Panel     Component Value Date/Time   CHOL 167 11/24/2013 1523   TRIG 69 11/24/2013 1523   HDL 50 11/24/2013 1523   CHOLHDL 3.3 11/24/2013 1523   VLDL 14 11/24/2013 1523   LDLCALC 103* 11/24/2013 1523       Assessment and plan:   1. Essential hypertension - BP still poorly controlled so will start on Lisinopril in addition to Norvasc and HCTZ - lisinopril (PRINIVIL,ZESTRIL) 10 MG tablet; Take 1 tablet (10 mg total) by mouth daily.  Dispense: 90 tablet; Refill: 3  2. Migraine without aura and with status migrainosus, not intractable -  Will treat with Fioricet. However if still persisting with intractability will refer to neurology. -  butalbital-acetaminophen-caffeine (FIORICET, ESGIC) 50-325-40 MG per tablet; Take 1 tablet by mouth 2 (two) times daily as needed for headache.  Dispense: 30 tablet; Refill: 0  3. Other male erectile dysfunction - sildenafil (VIAGRA) 50 MG tablet; Take 1 tablet (50 mg total) by mouth daily as needed for erectile dysfunction.  Dispense: 10 tablet; Refill: 0    Follow-up 1 month for HTN  The patient was given clear instructions to go to ER or return to medical center if symptoms don't improve, worsen or new problems develop. The patient verbalized understanding. The patient was told to call to get lab results if they haven't heard anything in the next week.     This note has been created with Education officer, environmental. Any transcriptional errors are unintentional.    MATTHEWS,MICHELLE A., MD [facility] [place], [place] [phone]   06/06/2014, 3:01 PM

## 2014-08-02 ENCOUNTER — Other Ambulatory Visit: Payer: Self-pay | Admitting: Internal Medicine

## 2014-10-30 ENCOUNTER — Other Ambulatory Visit: Payer: Self-pay

## 2014-10-30 DIAGNOSIS — I10 Essential (primary) hypertension: Secondary | ICD-10-CM

## 2014-10-30 MED ORDER — LISINOPRIL 10 MG PO TABS: 10.0000 mg | ORAL_TABLET | Freq: Every day | ORAL | Status: DC

## 2014-10-30 NOTE — Telephone Encounter (Signed)
Refill for naproxen 500mg  faxed into pharmacy. Thanks!

## 2014-10-31 ENCOUNTER — Other Ambulatory Visit: Payer: Self-pay

## 2014-10-31 MED ORDER — NAPROXEN 500 MG PO TABS: ORAL_TABLET | ORAL | Status: DC

## 2014-12-18 ENCOUNTER — Ambulatory Visit (INDEPENDENT_AMBULATORY_CARE_PROVIDER_SITE_OTHER): Payer: Medicare Other | Admitting: Family Medicine

## 2014-12-18 ENCOUNTER — Encounter: Payer: Self-pay | Admitting: Family Medicine

## 2014-12-18 VITALS — BP 123/84 | HR 67 | Temp 98.1°F | Resp 16 | Ht 76.0 in | Wt 226.0 lb

## 2014-12-18 DIAGNOSIS — R972 Elevated prostate specific antigen [PSA]: Secondary | ICD-10-CM | POA: Diagnosis not present

## 2014-12-18 DIAGNOSIS — N400 Enlarged prostate without lower urinary tract symptoms: Secondary | ICD-10-CM

## 2014-12-18 DIAGNOSIS — Z0000 Encounter for general adult medical examination without abnormal findings: Secondary | ICD-10-CM

## 2014-12-18 DIAGNOSIS — I10 Essential (primary) hypertension: Secondary | ICD-10-CM

## 2014-12-18 LAB — CBC WITH DIFFERENTIAL/PLATELET
Basophils Absolute: 0 10*3/uL (ref 0.0–0.1)
Basophils Relative: 0 % (ref 0–1)
Eosinophils Absolute: 0.2 10*3/uL (ref 0.0–0.7)
Eosinophils Relative: 3 % (ref 0–5)
HCT: 40.4 % (ref 39.0–52.0)
HEMOGLOBIN: 13.9 g/dL (ref 13.0–17.0)
Lymphocytes Relative: 27 % (ref 12–46)
Lymphs Abs: 1.5 10*3/uL (ref 0.7–4.0)
MCH: 32.3 pg (ref 26.0–34.0)
MCHC: 34.4 g/dL (ref 30.0–36.0)
MCV: 93.7 fL (ref 78.0–100.0)
MONO ABS: 0.5 10*3/uL (ref 0.1–1.0)
MPV: 10.2 fL (ref 8.6–12.4)
Monocytes Relative: 9 % (ref 3–12)
NEUTROS PCT: 61 % (ref 43–77)
Neutro Abs: 3.3 10*3/uL (ref 1.7–7.7)
PLATELETS: 273 10*3/uL (ref 150–400)
RBC: 4.31 MIL/uL (ref 4.22–5.81)
RDW: 13.6 % (ref 11.5–15.5)
WBC: 5.4 10*3/uL (ref 4.0–10.5)

## 2014-12-18 NOTE — Progress Notes (Signed)
Patient ID: Darryl Mccann, male   DOB: 03/01/1951, 64 y.o.   MRN: [id number]   Darryl Mccann, is a 64 y.o. male  [id number]  [id number]  DOB - 01/11/1951  CC:  Chief Complaint  Patient presents with  . Hypertension       HPI: Darryl Mccann is a 64 y.o. male here for follow-up BP. He reports is only taking two medications.However he has three on his medication list and he is unsure which two he is actually taking.  He reports doing well except for some right hip pain. There has been no injury. A review of health maintenance shows need for colonoscopy. Also a PSA last fall was elevated and he needs a referral to urology. He has migraine headache several days and week which are relieved with naproxen.  He reports having the shingles vaccine in 2014  Allergies  Allergen Reactions  . Cyclobenzaprine Hcl    Past Medical History  Diagnosis Date  . Hypertension   . Hyperlipidemia   . Stroke   . Cataract   . Glaucoma    Current Outpatient Prescriptions on File Prior to Visit  Medication Sig Dispense Refill  . amLODipine (NORVASC) 10 MG tablet Take 1 tablet (10 mg total) by mouth daily. 90 tablet 3  . butalbital-acetaminophen-caffeine (FIORICET, ESGIC) 50-325-40 MG per tablet TAKE ONE TABLET BY MOUTH EVERY 6 HOURS AS NEEDED FOR MIGRAINE 30 tablet 0  . butalbital-acetaminophen-caffeine (FIORICET, ESGIC) 50-325-40 MG per tablet Take 1 tablet by mouth 2 (two) times daily as needed for headache. 30 tablet 0  . Dorzolamide HCl-Timolol Mal PF 22.3-6.8 MG/ML SOLN Apply 1 drop to eye 2 (two) times daily. 180 each 3  . dorzolamide-timolol (COSOPT) 22.3-6.8 MG/ML ophthalmic solution PLACE 1 DROP IN EYE(S) TWICE DAILY 10 mL 2  . hydrochlorothiazide (HYDRODIURIL) 12.5 MG tablet Take 1 tablet (12.5 mg total) by mouth daily. 90 tablet 3  . lisinopril (PRINIVIL,ZESTRIL) 10 MG tablet Take 1 tablet (10 mg total) by mouth daily. 90 tablet 0  . naproxen (NAPROSYN) 500 MG tablet TAKE 1  TABLET TWICE A DAY WITH A MEAL 60 tablet 3  . polycarbophil (FIBERCON) 625 MG tablet Take 1 tablet (625 mg total) by mouth daily. 30 tablet 3  . aspirin 81 MG tablet Take 1 tablet (81 mg total) by mouth daily. (Patient not taking: Reported on 12/18/2014) 30 tablet 3  . sildenafil (VIAGRA) 50 MG tablet Take 1 tablet (50 mg total) by mouth daily as needed for erectile dysfunction. (Patient not taking: Reported on 12/18/2014) 10 tablet 0   No current facility-administered medications on file prior to visit.   History reviewed. No pertinent family history. History   Social History  . Marital Status: Single    Spouse Name: N/A  . Number of Children: N/A  . Years of Education: N/A   Occupational History  . Not on file.   Social History Main Topics  . Smoking status: Light Tobacco Smoker -- 0.07 packs/day for .5 years    Types: Cigarettes  . Smokeless tobacco: Never Used  . Alcohol Use: 0.6 oz/week    1 Cans of beer per week     Comment: Beer  . Drug Use: No  . Sexual Activity: Not on file   Other Topics Concern  . Not on file   Social History Narrative    Review of Systems: Constitutional: Negative for fever, chills, appetite change, weight loss. Admits to some fatigue HENT: Negative for ear pain, ear discharge.nose bleeds Eyes:  Negative for pain, discharge, redness, itching and visual disturbance.He describes a vague discomfort in his eyes 'like they are connected to his brain".  He has been eye doctor recently. Has glaucoma and cataracts Neck: Negative for pain, stiffness Respiratory: Negative for cough, shortness of breath,   Cardiovascular: Negative for chest pain, palpitations and leg swelling. Gastrointestinal: Negative for abdominal distention, abdominal pain, nausea, vomiting, diarrhea, constipations Genitourinary: Negative for dysuria, urgency, frequency, hematuria, flank pain,  Musculoskeletal: Negative for back pain, joint pain, joint  swelling, arthralgia and gait  problem.Negative for weakness. Admits to some trouble walking due to the hip pain. He does use a cane/ Neurological: Negative for dizziness, tremors, seizures, syncope,   light-headedness, numbness and headaches.  Hematological: Negative for easy bruising or bleeding Psychiatric/Behavioral: Negative for depression, anxiety, decreased concentration, confusion    Objective:   Filed Vitals:   12/18/14 1032  BP: 123/84  Pulse: 67  Temp: 98.1 F (36.7 C)  Resp: 16    Physical Exam: Constitutional: Patient appears well-developed and well-nourished. No distress. HENT: Normocephalic, atraumatic, External right and left ear normal. Oropharynx is clear and moist.  Eyes: Conjunctivae and EOM are normal. PERRLA, no scleral icterus. Neck: Normal ROM. Neck supple. No lymphadenopathy, No thyromegaly. CVS: RRR, S1/S2 +, no murmurs, no gallops, no rubs Pulmonary: Effort and breath sounds normal, no stridor, rhonchi, wheezes, rales.  Abdominal: Soft. Normoactive BS,, no distension, tenderness, rebound or guarding.  Musculoskeletal: Normal range of motion. No edema and no tenderness. Some tenderness over the right hip Neuro: Alert.Normal muscle tone coordination. Non-focal Skin: Skin is warm and dry. No rash noted. Not diaphoretic. No erythema. No pallor. Psychiatric: Normal mood and affect. Behavior, judgment, thought content normal.  Lab Results  Component Value Date   WBC 6.2 11/13/2013   HGB 14.7 11/13/2013   HCT 42.0 11/13/2013   MCV 89.4 11/13/2013   PLT 273 11/13/2013   Lab Results  Component Value Date   CREATININE 1.08 02/26/2014   BUN 16 02/26/2014   NA 141 02/26/2014   K 3.7 02/26/2014   CL 103 02/26/2014   CO2 26 02/26/2014    Lab Results  Component Value Date   HGBA1C 6.5* 02/26/2014   Lipid Panel     Component Value Date/Time   CHOL 167 11/24/2013 1523   TRIG 69 11/24/2013 1523   HDL 50 11/24/2013 1523   CHOLHDL 3.3 11/24/2013 1523   VLDL 14 11/24/2013 1523    LDLCALC 103* 11/24/2013 1523       Assessment and plan:   Hypertension, controlled -He is to call and let me know for sure what medications he is taking and I will refill -Carefull of salt.  Elevated PSA -referral to urology  Health Maintenanc -referral for colonoscopy  Follow-up here in 3 months.    The patient was given clear instructions to go to ER or return to medical center if symptoms don't improve, worsen or new problems develop. The patient verbalized understanding. The patient was told to call to get lab results if they haven't heard anything in the next week.       Henrietta HooverLinda C. Bernhardt, MSN, FNP-BC   12/18/2014, 10:57 AM

## 2014-12-18 NOTE — Patient Instructions (Signed)
Please check your meds when you get home and let me know exactly what you are taking for high blood pressure. Someone will call your about your appointments with urology and for colonscopy.

## 2014-12-19 LAB — LIPID PANEL
CHOL/HDL RATIO: 3.3 ratio
Cholesterol: 180 mg/dL (ref 0–200)
HDL: 55 mg/dL (ref >=40)
LDL Cholesterol: 112 mg/dL — ABNORMAL HIGH (ref 0–99)
Triglycerides: 66 mg/dL (ref <150)
VLDL: 13 mg/dL (ref 0–40)

## 2014-12-19 LAB — COMPLETE METABOLIC PANEL WITH GFR
ALT: 36 U/L (ref 0–53)
AST: 34 U/L (ref 0–37)
Albumin: 3.8 g/dL (ref 3.5–5.2)
Alkaline Phosphatase: 50 U/L (ref 39–117)
BUN: 20 mg/dL (ref 6–23)
CO2: 25 mEq/L (ref 19–32)
CREATININE: 1.35 mg/dL (ref 0.50–1.35)
Calcium: 9.6 mg/dL (ref 8.4–10.5)
Chloride: 101 mEq/L (ref 96–112)
GFR, Est African American: 63 mL/min
GFR, Est Non African American: 55 mL/min — ABNORMAL LOW
Glucose, Bld: 114 mg/dL — ABNORMAL HIGH (ref 70–99)
Potassium: 3.7 mEq/L (ref 3.5–5.3)
Sodium: 140 mEq/L (ref 135–145)
Total Bilirubin: 0.7 mg/dL (ref 0.2–1.2)
Total Protein: 7 g/dL (ref 6.0–8.3)

## 2015-01-26 ENCOUNTER — Other Ambulatory Visit: Payer: Self-pay | Admitting: Internal Medicine

## 2015-03-20 ENCOUNTER — Ambulatory Visit (INDEPENDENT_AMBULATORY_CARE_PROVIDER_SITE_OTHER): Payer: Medicare Other | Admitting: Family Medicine

## 2015-03-20 ENCOUNTER — Encounter: Payer: Self-pay | Admitting: Family Medicine

## 2015-03-20 VITALS — BP 112/72 | HR 71 | Temp 98.1°F | Resp 16 | Ht 76.0 in | Wt 228.0 lb

## 2015-03-20 DIAGNOSIS — E785 Hyperlipidemia, unspecified: Secondary | ICD-10-CM

## 2015-03-20 DIAGNOSIS — R51 Headache: Secondary | ICD-10-CM

## 2015-03-20 DIAGNOSIS — Z23 Encounter for immunization: Secondary | ICD-10-CM

## 2015-03-20 DIAGNOSIS — R519 Headache, unspecified: Secondary | ICD-10-CM

## 2015-03-20 DIAGNOSIS — I10 Essential (primary) hypertension: Secondary | ICD-10-CM

## 2015-03-20 LAB — POCT URINALYSIS DIP (DEVICE)
Bilirubin Urine: NEGATIVE
Glucose, UA: NEGATIVE mg/dL
Hgb urine dipstick: NEGATIVE
Ketones, ur: NEGATIVE mg/dL
Leukocytes, UA: NEGATIVE
Nitrite: NEGATIVE
PROTEIN: NEGATIVE mg/dL
Specific Gravity, Urine: 1.025 (ref 1.005–1.030)
Urobilinogen, UA: 2 mg/dL — ABNORMAL HIGH (ref 0.0–1.0)
pH: 5.5 (ref 5.0–8.0)

## 2015-03-20 MED ORDER — BUTALBITAL-APAP-CAFFEINE 50-325-40 MG PO TABS: ORAL_TABLET | ORAL | Status: AC

## 2015-03-20 NOTE — Progress Notes (Signed)
Subjective:    Patient ID: Darryl Mccann, male    DOB: 1951/02/03, 64 y.o.   MRN: [id number]  HPI Darryl Mccann, a 64 year old male with a history of hypertension, enlarged prostate,  and migraine headaches presents for a 3 month follow up of hypertension. He states that he has been taking medication consistently . He has been following a low fat, low sodium diet but has not been exercising. . Patient denies chest pain, dyspnea, fatigue, orthopnea, palpitations, syncope and tachypnea.  Cardiovascular risk factors include: advanced age (older than 65 for men, 72 for women) and sedentary lifestyle. Darryl Mccann also complains of persistent headaches that are throbbing in nature and affected by light and sound. Patient has been unable to take prescribed medications due to the fact that he is scheduled to have a prostate biopsy on 03/21/2015.  He denies blurred vision, nausea, vomiting, or diarrhea.  Past Medical History  Diagnosis Date  . Hypertension   . Hyperlipidemia   . Stroke   . Cataract   . Glaucoma    Immunization History  Administered Date(s) Administered  . Influenza,inj,Quad PF,36+ Mos 02/26/2014  . Pneumococcal Polysaccharide-23 02/26/2014   Social History   Social History  . Marital Status: Single    Spouse Name: N/A  . Number of Children: N/A  . Years of Education: N/A   Occupational History  . Not on file.   Social History Main Topics  . Smoking status: Light Tobacco Smoker -- 0.07 packs/day for .5 years    Types: Cigarettes  . Smokeless tobacco: Never Used  . Alcohol Use: 0.6 oz/week    1 Cans of beer per week     Comment: Beer  . Drug Use: No  . Sexual Activity: Not on file   Other Topics Concern  . Not on file   Social History Narrative    Review of Systems  Constitutional: Negative for unexpected weight change.  HENT: Negative.   Eyes: Positive for photophobia.  Respiratory: Negative for cough and shortness of breath.   Cardiovascular:  Negative for palpitations and leg swelling.  Gastrointestinal: Negative for diarrhea and constipation.  Endocrine: Negative.  Negative for polydipsia, polyphagia and polyuria.  Genitourinary: Negative for hematuria.  Musculoskeletal: Negative.   Neurological: Negative for tremors and weakness.  Psychiatric/Behavioral: Positive for sleep disturbance.       Objective:   Physical Exam  Constitutional: He is oriented to person, place, and time. He appears well-developed and well-nourished.  HENT:  Head: Normocephalic and atraumatic.  Right Ear: External ear normal.  Left Ear: External ear normal.  Nose: Nose normal.  Mouth/Throat: Oropharynx is clear and moist.  Eyes: Conjunctivae and EOM are normal. Pupils are equal, round, and reactive to light.  Neck: Normal range of motion. Neck supple.  Cardiovascular: Normal rate, regular rhythm, normal heart sounds and intact distal pulses.   Pulmonary/Chest: Effort normal and breath sounds normal.  Abdominal: Soft. Bowel sounds are normal.  Musculoskeletal:       Left knee: He exhibits decreased range of motion.  Post surgical scars  Neurological: He is alert and oriented to person, place, and time. He has normal reflexes.  Skin: Skin is warm and dry.  Psychiatric: He has a normal mood and affect. His behavior is normal. Judgment and thought content normal.      BP 112/72 mmHg  Pulse 71  Temp(Src) 98.1 F (36.7 C) (Oral)  Resp 16  Ht  (1.93 m)  Wt 228 lb (  103.42 kg)  BMI 27.76 kg/m2 Assessment & Plan:  1. Essential hypertension Patient's blood pressure is at goal on current medication regimen. Will continue medications. Will re-check blood pressure in 6 months. No proteinuria present.  - POCT urinalysis dipstick   2. Hyperlipidemia Reviewed lipid panel. LDL mildly elevated at 112, goal <100. Recommend continuing low fat, low sodium diet.   3. Frequent headaches Will re-start Butalbital-acetaminophen-caffeine every 6 hours as  needed. Darryl Mccann denies a change in headache pattern, headache is primarily to occipital aspect of head. Cranial Nerves intact. Recommend applying cool compresses to head, neck and face with a quiet, darkened room.  - butalbital-acetaminophen-caffeine (FIORICET, ESGIC) 50-325-40 MG tablet; TAKE ONE TABLET BY MOUTH EVERY 6 HOURS AS NEEDED FOR MIGRAINE  Dispense: 30 tablet; Refill: 1   4. Need for immunization against influenza - Flu Vaccine QUAD 36+ mos IM (Fluarix)    Patient has a prostate biopsy scheduled on 03/21/2015, reminded patient to refrain from taking Aspirin, Fioricet, or Naproxen prior to biopsy with Dr. Berneice HeinrichManny at [facility].   The patient was given clear instructions to go to ER or return to medical center if symptoms do not improve, worsen or new problems develop. The patient verbalized understanding. Will notify patient with laboratory results. Massie MaroonHollis,Ra Pfiester M, FNP

## 2015-03-27 ENCOUNTER — Other Ambulatory Visit: Payer: Self-pay | Admitting: Internal Medicine

## 2015-04-26 ENCOUNTER — Emergency Department (HOSPITAL_COMMUNITY): Payer: Medicare Other

## 2015-04-26 ENCOUNTER — Emergency Department (HOSPITAL_COMMUNITY)
Admission: EM | Admit: 2015-04-26 | Discharge: 2015-04-27 | Disposition: A | Discharge status: Home / Self Care | Payer: Medicare Other | Attending: Emergency Medicine | Admitting: Emergency Medicine

## 2015-04-26 DIAGNOSIS — F911 Conduct disorder, childhood-onset type: Secondary | ICD-10-CM | POA: Diagnosis not present

## 2015-04-26 DIAGNOSIS — Z79899 Other long term (current) drug therapy: Secondary | ICD-10-CM | POA: Insufficient documentation

## 2015-04-26 DIAGNOSIS — R451 Restlessness and agitation: Secondary | ICD-10-CM | POA: Diagnosis not present

## 2015-04-26 DIAGNOSIS — H409 Unspecified glaucoma: Secondary | ICD-10-CM | POA: Insufficient documentation

## 2015-04-26 DIAGNOSIS — F17210 Nicotine dependence, cigarettes, uncomplicated: Secondary | ICD-10-CM | POA: Diagnosis not present

## 2015-04-26 DIAGNOSIS — R4182 Altered mental status, unspecified: Secondary | ICD-10-CM | POA: Insufficient documentation

## 2015-04-26 DIAGNOSIS — F10129 Alcohol abuse with intoxication, unspecified: Secondary | ICD-10-CM | POA: Diagnosis not present

## 2015-04-26 DIAGNOSIS — H269 Unspecified cataract: Secondary | ICD-10-CM | POA: Insufficient documentation

## 2015-04-26 DIAGNOSIS — Z8639 Personal history of other endocrine, nutritional and metabolic disease: Secondary | ICD-10-CM | POA: Insufficient documentation

## 2015-04-26 DIAGNOSIS — Z8673 Personal history of transient ischemic attack (TIA), and cerebral infarction without residual deficits: Secondary | ICD-10-CM | POA: Diagnosis not present

## 2015-04-26 DIAGNOSIS — R454 Irritability and anger: Secondary | ICD-10-CM | POA: Insufficient documentation

## 2015-04-26 DIAGNOSIS — I10 Essential (primary) hypertension: Secondary | ICD-10-CM | POA: Insufficient documentation

## 2015-04-26 DIAGNOSIS — M21961 Unspecified acquired deformity of right lower leg: Secondary | ICD-10-CM | POA: Diagnosis not present

## 2015-04-26 DIAGNOSIS — M21962 Unspecified acquired deformity of left lower leg: Secondary | ICD-10-CM | POA: Insufficient documentation

## 2015-04-26 DIAGNOSIS — IMO0002 Reserved for concepts with insufficient information to code with codable children: Secondary | ICD-10-CM

## 2015-04-26 LAB — ETHANOL: ALCOHOL ETHYL (B): 267 mg/dL — AB (ref <5)

## 2015-04-26 LAB — I-STAT CHEM 8, ED
BUN: 22 mg/dL — ABNORMAL HIGH (ref 6–20)
CHLORIDE: 104 mmol/L (ref 101–111)
CREATININE: 1.8 mg/dL — AB (ref 0.61–1.24)
Calcium, Ion: 1.08 mmol/L — ABNORMAL LOW (ref 1.13–1.30)
GLUCOSE: 141 mg/dL — AB (ref 65–99)
HCT: 46 % (ref 39.0–52.0)
Hemoglobin: 15.6 g/dL (ref 13.0–17.0)
POTASSIUM: 3.4 mmol/L — AB (ref 3.5–5.1)
Sodium: 141 mmol/L (ref 135–145)
TCO2: 18 mmol/L (ref 0–100)

## 2015-04-26 LAB — CBC WITH DIFFERENTIAL/PLATELET
BASOS ABS: 0 10*3/uL (ref 0.0–0.1)
Basophils Relative: 0 %
Eosinophils Absolute: 0.2 10*3/uL (ref 0.0–0.7)
Eosinophils Relative: 3 %
HEMATOCRIT: 40.9 % (ref 39.0–52.0)
Hemoglobin: 14.3 g/dL (ref 13.0–17.0)
LYMPHS PCT: 28 %
Lymphs Abs: 2.2 10*3/uL (ref 0.7–4.0)
MCH: 32.4 pg (ref 26.0–34.0)
MCHC: 35 g/dL (ref 30.0–36.0)
MCV: 92.7 fL (ref 78.0–100.0)
Monocytes Absolute: 0.5 10*3/uL (ref 0.1–1.0)
Monocytes Relative: 7 %
NEUTROS ABS: 4.7 10*3/uL (ref 1.7–7.7)
Neutrophils Relative %: 62 %
PLATELETS: 308 10*3/uL (ref 150–400)
RBC: 4.41 MIL/uL (ref 4.22–5.81)
RDW: 13 % (ref 11.5–15.5)
WBC: 7.7 10*3/uL (ref 4.0–10.5)

## 2015-04-26 LAB — CBG MONITORING, ED: Glucose-Capillary: 132 mg/dL — ABNORMAL HIGH (ref 65–99)

## 2015-04-26 MED ORDER — LORAZEPAM 2 MG/ML IJ SOLN
2.0000 mg | Freq: Once | INTRAMUSCULAR | Status: AC
  Administered 2015-04-26: 2 mg via INTRAVENOUS
  Filled 2015-04-26: qty 1

## 2015-04-26 MED ORDER — HALOPERIDOL LACTATE 5 MG/ML IJ SOLN
5.0000 mg | Freq: Once | INTRAMUSCULAR | Status: AC
  Administered 2015-04-26: 5 mg via INTRAMUSCULAR
  Filled 2015-04-26: qty 1

## 2015-04-26 MED ORDER — LORAZEPAM 2 MG/ML IJ SOLN
1.0000 mg | Freq: Once | INTRAMUSCULAR | Status: AC
Start: 2015-04-26 — End: 2015-04-26
  Administered 2015-04-26: 1 mg via INTRAVENOUS
  Filled 2015-04-26: qty 1

## 2015-04-26 MED ORDER — SODIUM CHLORIDE 0.9 % IV BOLUS (SEPSIS)
1000.0000 mL | Freq: Once | INTRAVENOUS | Status: AC
  Administered 2015-04-27: 1000 mL via INTRAVENOUS
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
## 2015-04-26 NOTE — ED Provider Notes (Signed)
Pt with cc of unresponsive per EMS.  Filed Vitals:   04/26/15 2101 04/26/15 2301  BP: 171/95 128/84  Pulse: 88 98  Temp: 97.8 F (36.6 C)   Resp: 20 18    Pt combative at arrival. Haldol given. He is intoxicated. CT and other imaging ordered - if imaging is neg, and pt is clinically sober, he can be discharged. Besides ankle pain, pt has no complains.  Derwood KaplanAnkit Berda Shelvin, MD 04/26/15 2351
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
## 2015-04-26 NOTE — ED Notes (Signed)
Pt has 2 black shoes, black socks, 1 black sweatshirt jacket, 1 pair of jeans, 1 black belt, 1 jean jacket, 1 black watch/broken, wallet brown/contents locked up. Papers, 5 rings,3 on key ring locked up with security, cool pad cell phone locked up with security.

## 2015-04-27 MED ORDER — SODIUM CHLORIDE 0.9 % IV BOLUS (SEPSIS)
1000.0000 mL | Freq: Once | INTRAVENOUS | Status: AC
  Administered 2015-04-27: 1000 mL via INTRAVENOUS

## 2015-04-27 NOTE — ED Notes (Signed)
Pt dressed and staff attempted to walk pt. Pt was unsteady and staff was unable to ambulate pt.

## 2015-04-27 NOTE — ED Notes (Signed)
Bed: WA09 Expected date:  Expected time:  Means of arrival:  Comments: RESB
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
## 2015-04-27 NOTE — ED Provider Notes (Signed)
Patient is clinically sober. He is talking coherently, and is demonstrating rational thought process. He is not sure how he ended up in the ER. He has no complains. We shall ambulate him, and if he is able, we shall discharge him.  Filed Vitals:   04/27/15 0221 04/27/15 0400  BP: 98/57 90/56  Pulse: 80 76  Temp:    Resp: 16      Derwood KaplanAnkit Huldah Marin, MD 04/27/15 [phone]

## 2015-04-27 NOTE — ED Notes (Signed)
Awake. Verbally responsive. A/O x4. Resp even and unlabored. No audible adventitious breath sounds noted. ABC's intact.  

## 2015-04-27 NOTE — Discharge Instructions (Signed)
Alcohol Intoxication  Alcohol intoxication occurs when the amount of alcohol that a person has consumed impairs his or her ability to mentally and physically function. Alcohol directly impairs the normal chemical activity of the brain. Drinking large amounts of alcohol can lead to changes in mental function and behavior, and it can cause many physical effects that can be harmful.   Alcohol intoxication can range in severity from mild to very severe. Various factors can affect the level of intoxication that occurs, such as the person's age, gender, weight, frequency of alcohol consumption, and the presence of other medical conditions (such as diabetes, seizures, or heart conditions). Dangerous levels of alcohol intoxication may occur when people drink large amounts of alcohol in a short period (binge drinking). Alcohol can also be especially dangerous when combined with certain prescription medicines or "recreational" drugs.  SIGNS AND SYMPTOMS  Some common signs and symptoms of mild alcohol intoxication include:  · Loss of coordination.  · Changes in mood and behavior.  · Impaired judgment.  · Slurred speech.  As alcohol intoxication progresses to more severe levels, other signs and symptoms will appear. These may include:  · Vomiting.  · Confusion and impaired memory.  · Slowed breathing.  · Seizures.  · Loss of consciousness.  DIAGNOSIS   Your health care provider will take a medical history and perform a physical exam. You will be asked about the amount and type of alcohol you have consumed. Blood tests will be done to measure the concentration of alcohol in your blood. In many places, your blood alcohol level must be lower than 80 mg/dL (0.08%) to legally drive. However, many dangerous effects of alcohol can occur at much lower levels.   TREATMENT   People with alcohol intoxication often do not require treatment. Most of the effects of alcohol intoxication are temporary, and they go away as the alcohol naturally  leaves the body. Your health care provider will monitor your condition until you are stable enough to go home. Fluids are sometimes given through an IV access tube to help prevent dehydration.   HOME CARE INSTRUCTIONS  · Do not drive after drinking alcohol.  · Stay hydrated. Drink enough water and fluids to keep your urine clear or pale yellow. Avoid caffeine.    · Only take over-the-counter or prescription medicines as directed by your health care provider.    SEEK MEDICAL CARE IF:   · You have persistent vomiting.    · You do not feel better after a few days.  · You have frequent alcohol intoxication. Your health care provider can help determine if you should see a substance use treatment counselor.  SEEK IMMEDIATE MEDICAL CARE IF:   · You become shaky or tremble when you try to stop drinking.    · You shake uncontrollably (seizure).    · You throw up (vomit) blood. This may be bright red or may look like black coffee grounds.    · You have blood in your stool. This may be bright red or may appear as a black, tarry, bad smelling stool.    · You become lightheaded or faint.    MAKE SURE YOU:   · Understand these instructions.  · Will watch your condition.  · Will get help right away if you are not doing well or get worse.     This information is not intended to replace advice given to you by your health care provider. Make sure you discuss any questions you have with your health care provider.       Document Released: 02/25/2005 Document Revised: 01/18/2013 Document Reviewed: 10/21/2012  Elsevier Interactive Patient Education ©2016 [employer]

## 2015-04-27 NOTE — ED Notes (Signed)
Resting quietly with eyes closed. Easily arousable. Verbally responsive. A/O x4. Resp even and unlabored. No audible adventitious breath sounds noted. ABC's intact. No behavior problems noted.

## 2015-04-27 NOTE — ED Notes (Signed)
Pt given and signed for personal items. Pt verbalized concerns of having 2 cell phones and only one in the packet.

## 2015-04-27 NOTE — ED Notes (Signed)
Pt ambulated with steady gait. Pt given breakfast tray and plans to d/c when finished.

## 2015-04-27 NOTE — ED Notes (Addendum)
Resting quietly with eyes closed. Easily arousable. Verbally responsive. A/O x4. Resp even and unlabored. No audible adventitious breath sounds noted. ABC's intact. IV saline lock patent and intact.  

## 2015-05-09 ENCOUNTER — Other Ambulatory Visit: Payer: Self-pay

## 2015-05-09 DIAGNOSIS — I10 Essential (primary) hypertension: Secondary | ICD-10-CM

## 2015-05-09 MED ORDER — AMLODIPINE BESYLATE 10 MG PO TABS: 10.0000 mg | ORAL_TABLET | Freq: Every day | ORAL | Status: AC

## 2015-05-09 MED ORDER — HYDROCHLOROTHIAZIDE 12.5 MG PO TABS: ORAL_TABLET | ORAL | Status: AC

## 2015-05-09 MED ORDER — LISINOPRIL 10 MG PO TABS: ORAL_TABLET | ORAL | Status: AC

## 2015-05-09 MED ORDER — LATANOPROST 0.005 % OP SOLN: 1.0000 [drp] | Freq: Every morning | OPHTHALMIC | Status: AC

## 2015-06-03 ENCOUNTER — Other Ambulatory Visit: Payer: Self-pay | Admitting: Internal Medicine

## 2015-06-21 ENCOUNTER — Ambulatory Visit: Payer: No Typology Code available for payment source | Admitting: Family Medicine

## 2015-07-10 ENCOUNTER — Ambulatory Visit: Payer: No Typology Code available for payment source | Admitting: Family Medicine

## 2015-07-31 DEATH — deceased

## 2015-08-13 ENCOUNTER — Ambulatory Visit: Payer: No Typology Code available for payment source | Admitting: Family Medicine
